# Patient Record
Sex: Female | Born: 1970 | Race: Black or African American | Hispanic: No | Marital: Single | Smoking: Never smoker
Health system: Southern US, Community
[De-identification: ages and names within clinical notes are randomized; demographics above are authoritative.]

## PROBLEM LIST (undated history)

## (undated) DIAGNOSIS — J45909 Unspecified asthma, uncomplicated: Secondary | ICD-10-CM

## (undated) DIAGNOSIS — J449 Chronic obstructive pulmonary disease, unspecified: Secondary | ICD-10-CM

---

## 2017-05-20 ENCOUNTER — Emergency Department (HOSPITAL_COMMUNITY): Payer: Medicaid Other

## 2017-05-20 ENCOUNTER — Encounter (HOSPITAL_COMMUNITY): Payer: Self-pay | Admitting: Emergency Medicine

## 2017-05-20 ENCOUNTER — Emergency Department (HOSPITAL_COMMUNITY)
Admission: EM | Admit: 2017-05-20 | Discharge: 2017-05-20 | Disposition: A | Discharge status: Home / Self Care | Payer: Medicaid Other | Attending: Emergency Medicine | Admitting: Emergency Medicine

## 2017-05-20 DIAGNOSIS — Z79899 Other long term (current) drug therapy: Secondary | ICD-10-CM | POA: Diagnosis not present

## 2017-05-20 DIAGNOSIS — L03116 Cellulitis of left lower limb: Secondary | ICD-10-CM | POA: Insufficient documentation

## 2017-05-20 DIAGNOSIS — J441 Chronic obstructive pulmonary disease with (acute) exacerbation: Secondary | ICD-10-CM | POA: Diagnosis not present

## 2017-05-20 DIAGNOSIS — J45909 Unspecified asthma, uncomplicated: Secondary | ICD-10-CM | POA: Diagnosis not present

## 2017-05-20 DIAGNOSIS — M79672 Pain in left foot: Secondary | ICD-10-CM | POA: Diagnosis not present

## 2017-05-20 DIAGNOSIS — R05 Cough: Secondary | ICD-10-CM | POA: Diagnosis present

## 2017-05-20 HISTORY — DX: Unspecified asthma, uncomplicated: J45.909

## 2017-05-20 HISTORY — DX: Chronic obstructive pulmonary disease, unspecified: J44.9

## 2017-05-20 MED ORDER — CEPHALEXIN 500 MG PO CAPS: 500.0000 mg | ORAL_CAPSULE | Freq: Four times a day (QID) | ORAL | 0 refills | Status: AC

## 2017-05-20 MED ORDER — IPRATROPIUM-ALBUTEROL 0.5-2.5 (3) MG/3ML IN SOLN
3.0000 mL | Freq: Once | RESPIRATORY_TRACT | Status: AC
  Administered 2017-05-20: 3 mL via RESPIRATORY_TRACT
  Filled 2017-05-20: qty 3

## 2017-05-20 MED ORDER — METHYLPREDNISOLONE SODIUM SUCC 125 MG IJ SOLR
125.0000 mg | Freq: Once | INTRAMUSCULAR | Status: AC
  Administered 2017-05-20: 125 mg via INTRAVENOUS
  Filled 2017-05-20: qty 2

## 2017-05-20 MED ORDER — OXYCODONE-ACETAMINOPHEN 5-325 MG PO TABS
1.0000 | ORAL_TABLET | Freq: Once | ORAL | Status: AC
  Administered 2017-05-20: 1 via ORAL
  Filled 2017-05-20: qty 1

## 2017-05-20 MED ORDER — PREDNISONE 20 MG PO TABS: 60.0000 mg | ORAL_TABLET | Freq: Every day | ORAL | 0 refills | Status: AC

## 2017-05-20 MED ORDER — DEXTROSE 5 % IV SOLN
1.0000 g | Freq: Once | INTRAVENOUS | Status: AC
  Administered 2017-05-20: 1 g via INTRAVENOUS
  Filled 2017-05-20: qty 10

## 2017-05-20 MED ORDER — ALBUTEROL SULFATE (2.5 MG/3ML) 0.083% IN NEBU
2.5000 mg | INHALATION_SOLUTION | Freq: Once | RESPIRATORY_TRACT | Status: AC
  Administered 2017-05-20: 2.5 mg via RESPIRATORY_TRACT
  Filled 2017-05-20: qty 3

## 2017-05-20 MED ORDER — ALBUTEROL SULFATE (2.5 MG/3ML) 0.083% IN NEBU
5.0000 mg | INHALATION_SOLUTION | Freq: Once | RESPIRATORY_TRACT | Status: AC
  Administered 2017-05-20: 5 mg via RESPIRATORY_TRACT
  Filled 2017-05-20: qty 6

## 2017-05-20 MED ORDER — IPRATROPIUM BROMIDE 0.02 % IN SOLN
0.5000 mg | Freq: Once | RESPIRATORY_TRACT | Status: AC
  Administered 2017-05-20: 0.5 mg via RESPIRATORY_TRACT
  Filled 2017-05-20: qty 2.5

## 2017-05-20 MED ORDER — KETOROLAC TROMETHAMINE 15 MG/ML IJ SOLN
15.0000 mg | Freq: Once | INTRAMUSCULAR | Status: AC
  Administered 2017-05-20: 15 mg via INTRAVENOUS
  Filled 2017-05-20: qty 1

## 2017-05-20 NOTE — ED Provider Notes (Signed)
[facility]-EMERGENCY DEPT Provider Note   CSN: [id number] Arrival date & time: 05/20/17  1435     History   Chief Complaint Chief Complaint  Patient presents with  . Cough  . Foot Pain    HPI Tara Sanford is a 46 y.o. female with hx of COPD and asthma presents to the ED with shortness of breath and cough x 2 weeks. She has been using her neb treatment without relief. She states that she has been using her rescue inhale every few minutes without relief.  Patient also reports that she has swelling and pain to the left foot that started one week ago. She states that there was an area on the top of her foot that she scratched and thinks she got infected. She has difficulty walking due to the pain in the foot.   The history is provided by the patient. No language interpreter was used.  Cough  This is a new problem. The current episode started more than 1 week ago. The problem has been gradually worsening. The cough is productive of sputum. There has been no fever. Associated symptoms include shortness of breath and wheezing. Pertinent negatives include no chills and no headaches. She is not a smoker. Her past medical history is significant for COPD and asthma.  Foot Pain  Associated symptoms include shortness of breath. Pertinent negatives include no headaches.    Past Medical History:  Diagnosis Date  . Asthma   . COPD (chronic obstructive pulmonary disease) (HCC)     There are no active problems to display for this patient.   History reviewed. No pertinent surgical history.  OB History    No data available       Home Medications    Prior to Admission medications   Medication Sig Start Date End Date Taking? Authorizing Provider  albuterol (PROVENTIL HFA;VENTOLIN HFA) 108 (90 Base) MCG/ACT inhaler Inhale 1-2 puffs into the lungs every 6 (six) hours as needed for wheezing or shortness of breath.   Yes [provider]  albuterol (PROVENTIL) (2.5 MG/3ML) 0.083% nebulizer  solution Take 2.5 mg by nebulization every 6 (six) hours as needed for wheezing. 03/06/17  Yes [provider]  divalproex (DEPAKOTE ER) 500 MG 24 hr tablet Take 1,000 mg by mouth daily.   Yes [provider]  fluticasone (FLOVENT HFA) 220 MCG/ACT inhaler Inhale 2 puffs into the lungs 2 (two) times daily. 03/20/17 03/20/18 Yes [provider]  QUEtiapine (SEROQUEL) 100 MG tablet Take 100-400 mg by mouth 3 (three) times daily. Take 100mg  by mouth twice daily and then take 400mg  by mouth at bedtime.   Yes [provider]    Family History No family history on file.  Social History Social History  Substance Use Topics  . Smoking status: Never Smoker  . Smokeless tobacco: Never Used  . Alcohol use No     Allergies   Ibuprofen   Review of Systems Review of Systems  Constitutional: Negative for chills and fever.  HENT: Positive for congestion.   Respiratory: Positive for cough, shortness of breath and wheezing.   Cardiovascular: Positive for leg swelling (left).  Gastrointestinal: Negative for nausea and vomiting.  Skin: Positive for wound.  Neurological: Negative for headaches.  Psychiatric/Behavioral: Negative for confusion.     Physical Exam Updated Vital Signs BP 135/65 (BP Location: Right Arm)   Pulse 79   Temp 98.1 F (36.7 C) (Oral)   Resp 20   SpO2 98%   Physical Exam  Constitutional: She is oriented to person, place, and time. She appears well-developed and well-nourished. No distress.  HENT:  Head: Normocephalic.  Mouth/Throat: Uvula is midline, oropharynx is clear and moist and mucous membranes are normal.  Eyes: EOM are normal.  Neck: Neck supple.  Cardiovascular: Normal rate and regular rhythm.   Pulmonary/Chest: Effort normal. She has decreased breath sounds. She has wheezes. She has rhonchi.  Abdominal: Soft. There is no tenderness.  Musculoskeletal:       Left foot: There is tenderness and swelling.  There is a scabbed  over wound to the dorsum of the left foot. The foot is tender with palpation. Pedal pulse 2+.   Neurological: She is alert and oriented to person, place, and time. No cranial nerve deficit.  Skin: Skin is warm and dry.  Psychiatric: She has a normal mood and affect.  Nursing note and vitals reviewed.    ED Treatments / Results  Labs (all labs ordered are listed, but only abnormal results are displayed) Labs Reviewed - No data to display  Radiology Dg Chest 2 View  Result Date: 05/20/2017 CLINICAL DATA:  Cough for 2 weeks.  Left foot pain and swelling. EXAM: CHEST  2 VIEW COMPARISON:  None. FINDINGS: The cardiomediastinal contours are normal. Mild bronchial thickening. Pulmonary vasculature is normal. No consolidation, pleural effusion, or pneumothorax. No acute osseous abnormalities are seen. IMPRESSION: Mild bronchial thickening of uncertain chronicity. Electronically Signed   By: Rubye OaksMelanie  Ehinger M.D.   On: 05/20/2017 18:03   Dg Foot Complete Left  Result Date: 05/20/2017 CLINICAL DATA:  Left foot pain and swelling. EXAM: LEFT FOOT - COMPLETE 3+ VIEW COMPARISON:  None. FINDINGS: No acute fracture or malalignment. Minimal degenerative changes of the first MTP and IP joints. Achilles and plantar enthesophytes. Bone mineralization is normal. Moderate forefoot soft tissue swelling. IMPRESSION: Moderate forefoot soft tissue swelling. No acute osseous abnormality. Electronically Signed   By: Obie DredgeWilliam T Derry M.D.   On: 05/20/2017 18:04    Procedures Procedures (including critical care time)  Medications Ordered in ED Medications  oxyCODONE-acetaminophen (PERCOCET/ROXICET) 5-325 MG per tablet 1 tablet (1 tablet Oral Given 05/20/17 1630)  ipratropium-albuterol (DUONEB) 0.5-2.5 (3) MG/3ML nebulizer solution 3 mL (3 mLs Nebulization Given 05/20/17 1632)  albuterol (PROVENTIL) (2.5 MG/3ML) 0.083% nebulizer solution 2.5 mg (2.5 mg Nebulization Given 05/20/17 1815)     Initial Impression / Assessment  and Plan / ED Course  I have reviewed the triage vital signs and the nursing notes.   Final Clinical Impressions(s) / ED Diagnoses  New Prescriptions New Prescriptions   No medications on file   6pm care turned over to Elpidio AnisShari Upstill, [facility] to continue treatment and make disposition.    Kerrie Buffaloeese, Vincente Asbridge [place], [place] 05/20/17 Ayesha [id number]    Mancel BaleWentz, Elliott, MD 05/20/17 [phone]

## 2017-05-20 NOTE — ED Triage Notes (Signed)
[facility]. Reports hx of COPD. Also complains of left foot pain and swelling for 1 week. Pain increase with ambulation.

## 2017-05-20 NOTE — ED Provider Notes (Signed)
Cough, h/o COPD Has had one duoneb, getting 2nd Needs prednisone  Foot pain - swelling and pain interfering with walking Recent sore that is now causing sxs of swelling  Plan: IV, solumedrol, abx for cellulitis - re=-evaluate after nebs  Patient feeling significantly improved after medications via IV and nebulizer x 3. She appears comfortable.   She can be discharged home. Will provide additional 3 days steroid, Keflex for mild left foot cellulitis.   Elpidio AnisUpstill, Zabella Wease, PA-C 05/20/17 2049    Mancel BaleWentz, Elliott, MD 05/20/17 [phone]

## 2017-05-20 NOTE — ED Notes (Signed)
Gave report to Lilibeth, RN.  

## 2017-05-20 NOTE — ED Notes (Signed)
Patient has been placed on continueous pulse monitor and auto checks of blood pressure.

## 2018-09-10 IMAGING — CR DG CHEST 2V
2 series · 2 of 2 positions shown · non-contrast
Comparison: None.

CLINICAL DATA: Cough for 2 weeks.  Left foot pain and swelling.

EXAM:
CHEST  2 VIEW

[w chest lat]
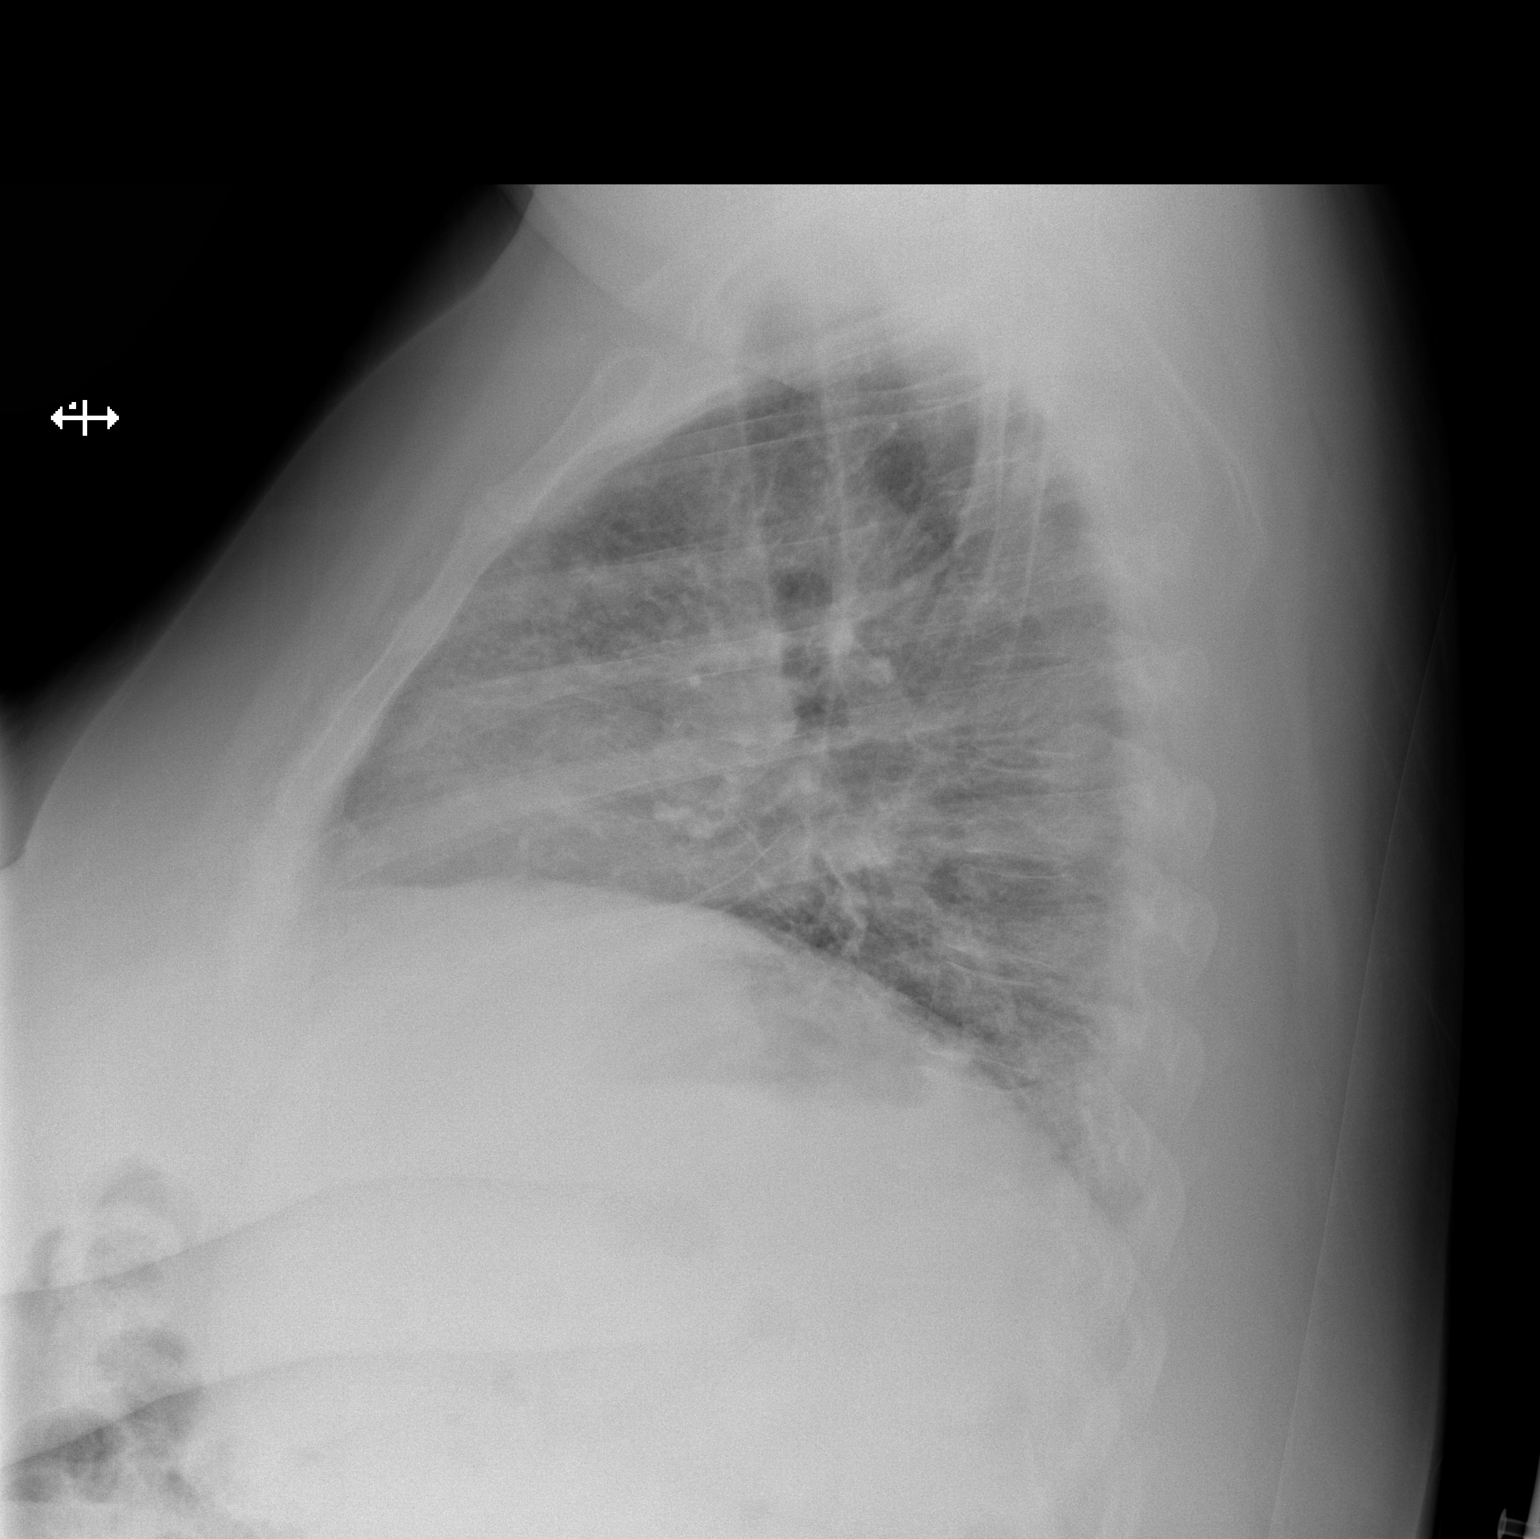

[x chest ap]
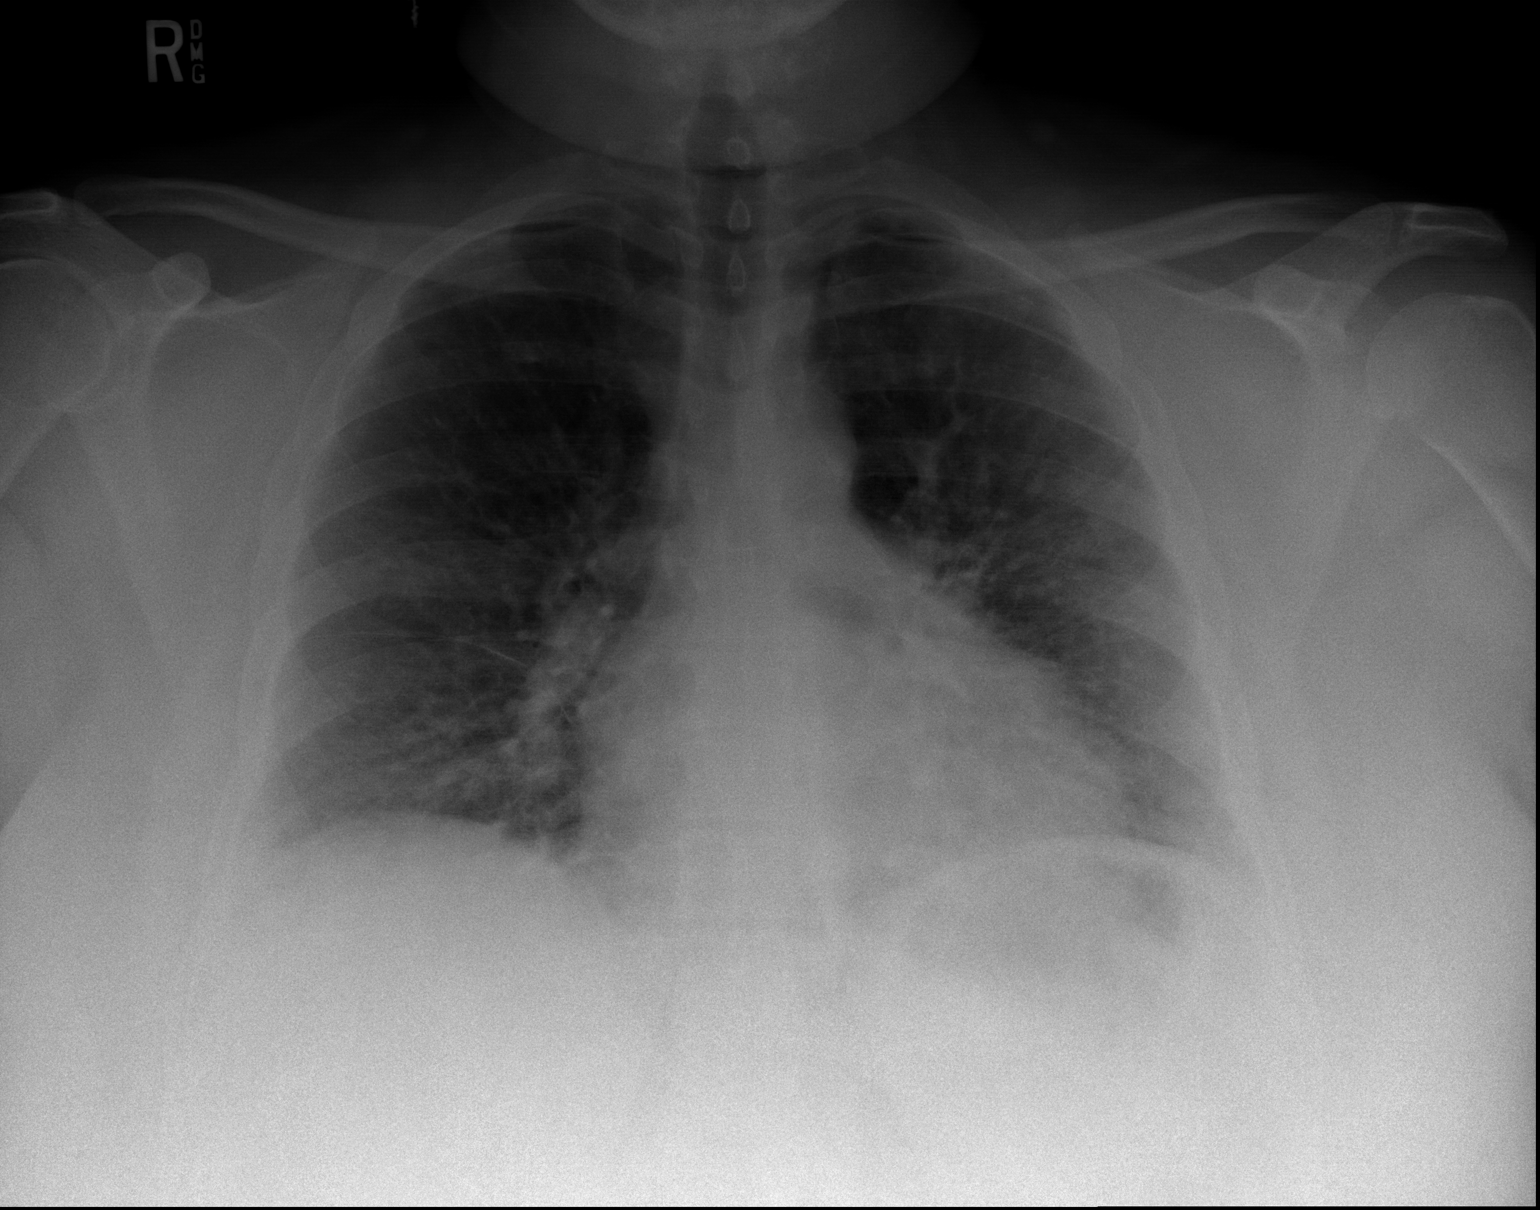

[2 of 2 positions shown; findings below may reference images not displayed]

FINDINGS: The cardiomediastinal contours are normal. Mild bronchial
thickening. Pulmonary vasculature is normal. No consolidation,
pleural effusion, or pneumothorax. No acute osseous abnormalities
are seen.
IMPRESSION: Mild bronchial thickening of uncertain chronicity.

## 2018-09-10 IMAGING — CR DG FOOT COMPLETE 3+V*L*
3 series · 3 of 3 positions shown · non-contrast
Comparison: None.

CLINICAL DATA: Left foot pain and swelling.

EXAM:
LEFT FOOT - COMPLETE 3+ VIEW

[x foot ap left]
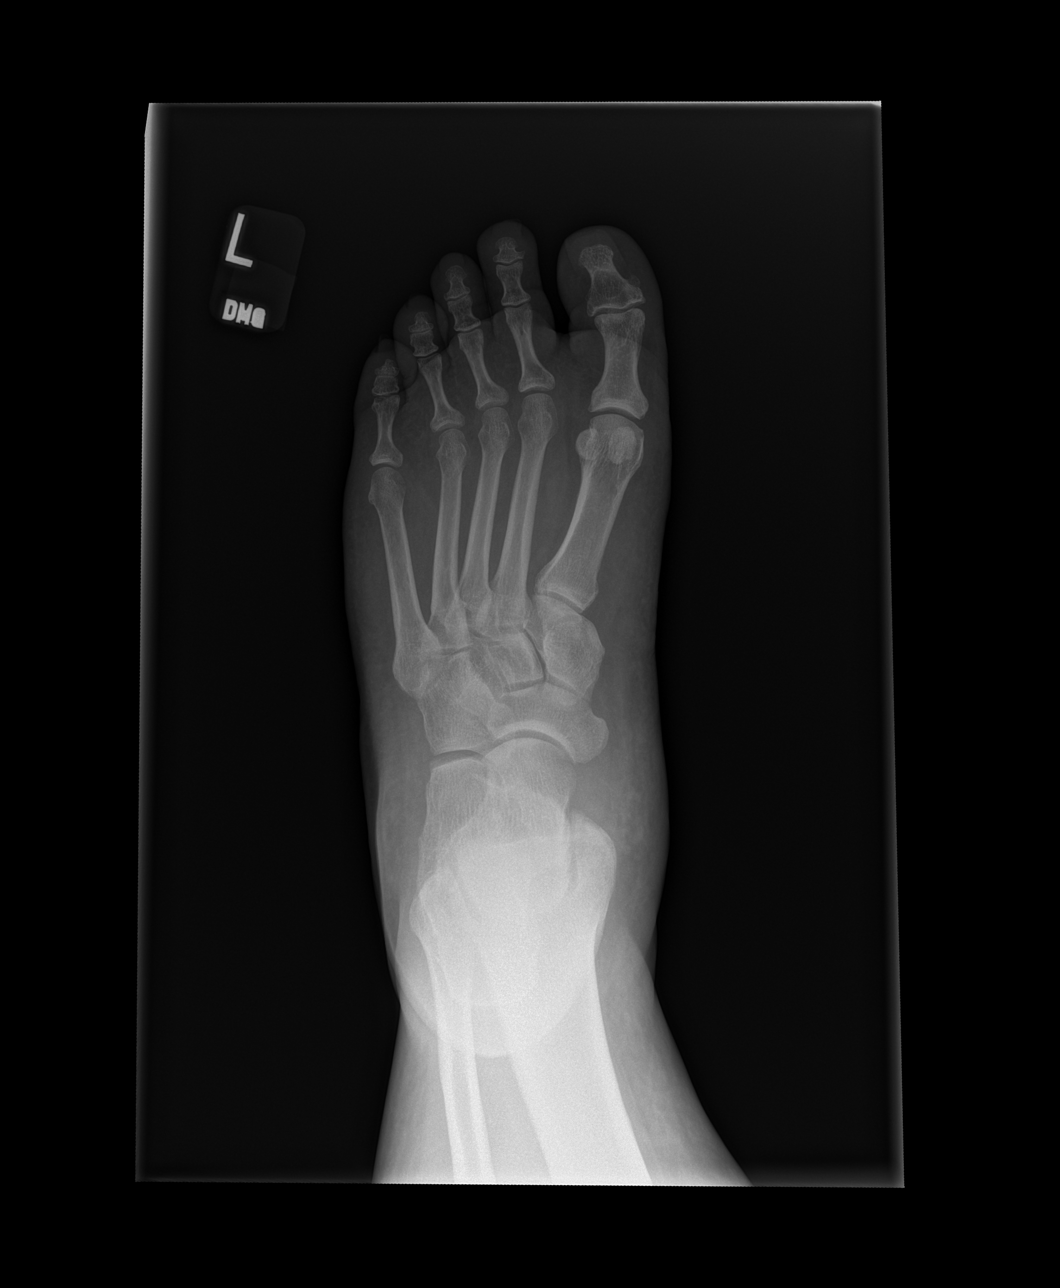

[x foot obl left]
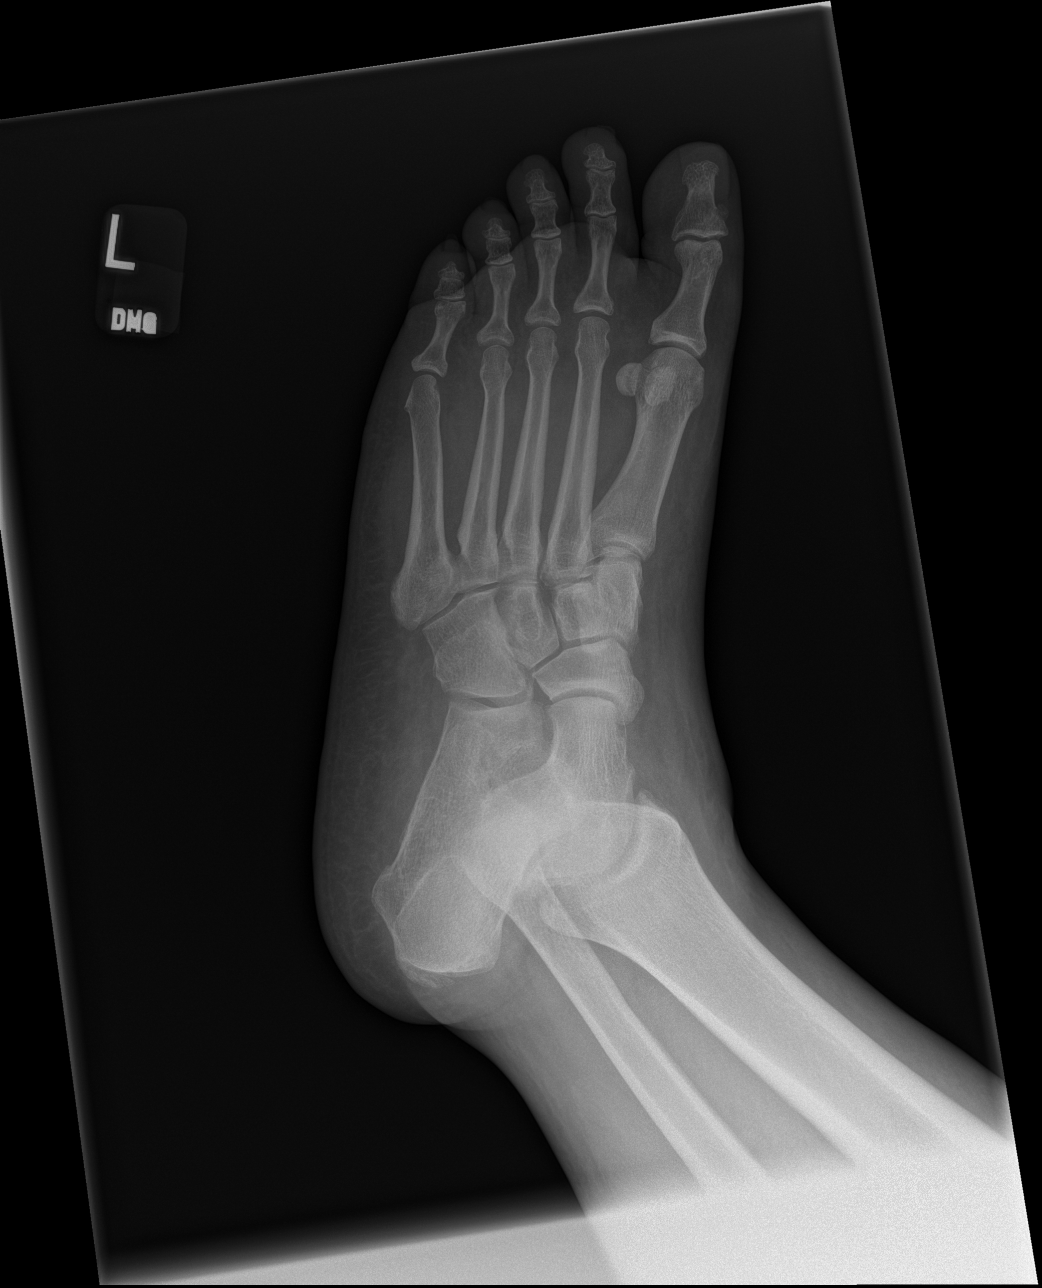

[x foot lat left]
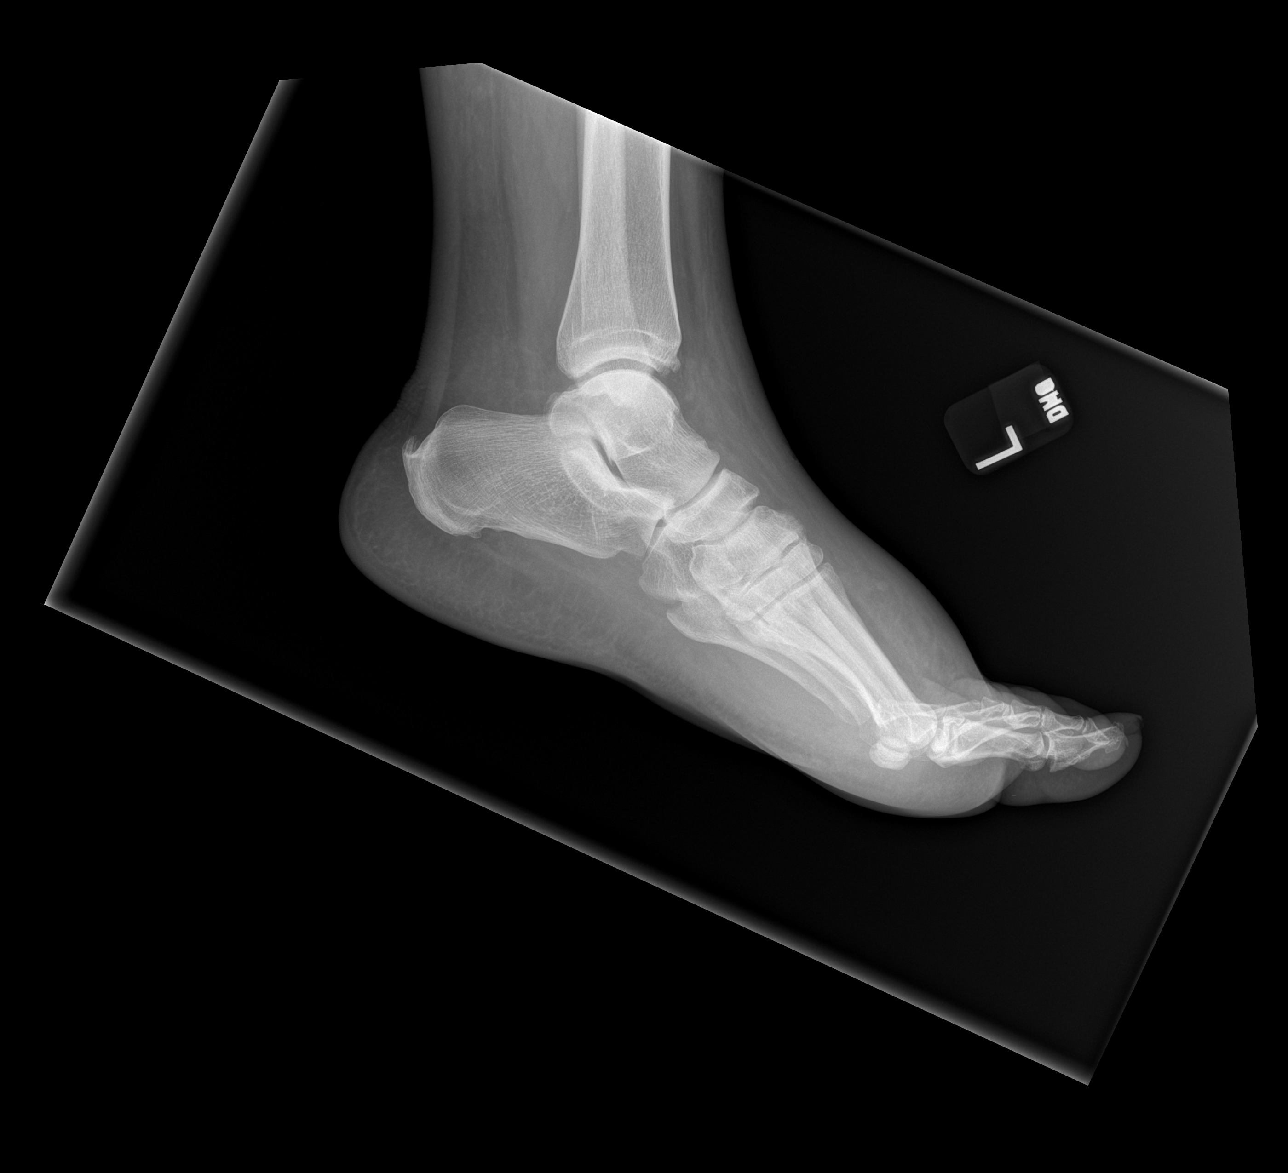

[3 of 3 positions shown; findings below may reference images not displayed]

FINDINGS: No acute fracture or malalignment. Minimal degenerative changes of
the first MTP and IP joints. Achilles and plantar enthesophytes.
Bone mineralization is normal. Moderate forefoot soft tissue
swelling.
IMPRESSION: Moderate forefoot soft tissue swelling. No acute osseous
abnormality.
